# Patient Record
Sex: Female | Born: 1985 | Race: Black or African American | Hispanic: No | Marital: Single | Smoking: Former smoker
Health system: Southern US, Community
[De-identification: ages and names within clinical notes are randomized; demographics above are authoritative.]

---

## 2009-09-14 ENCOUNTER — Ambulatory Visit: Payer: Self-pay | Admitting: Unknown Physician Specialty

## 2011-05-11 IMAGING — US US OB < 14 WEEKS
1 series · 17 of 28 positions shown · non-contrast
Comparison: none

REASON FOR EXAM: bleeding with tissue passage
COMMENTS:   May transport without cardiac monitor

[Series 1: us ob < 14 weeks · 17 of 41 slices shown]
[im 1/41]
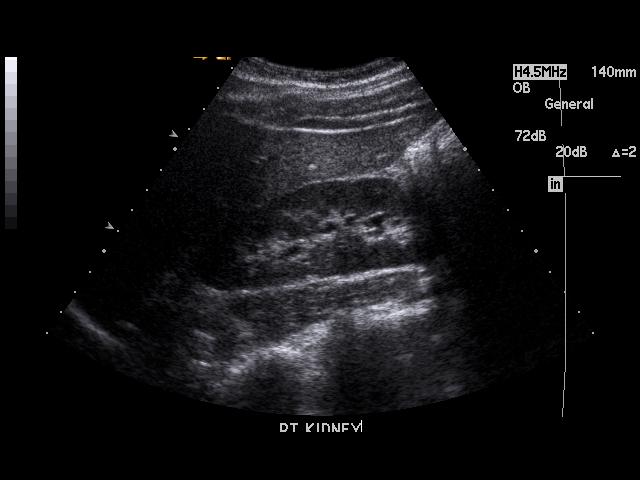
[im 3/41]
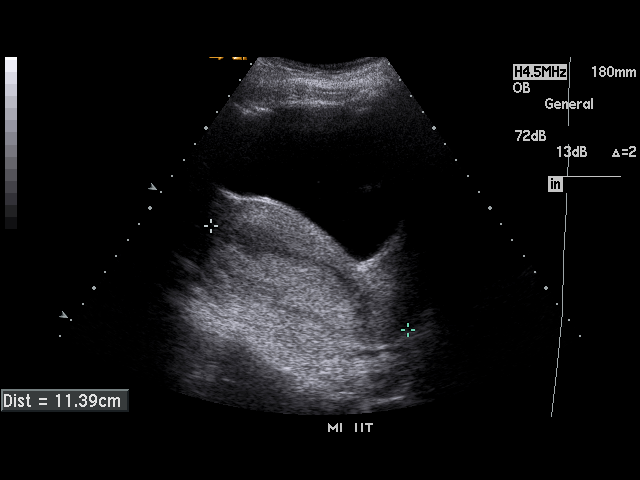
[im 6/41]
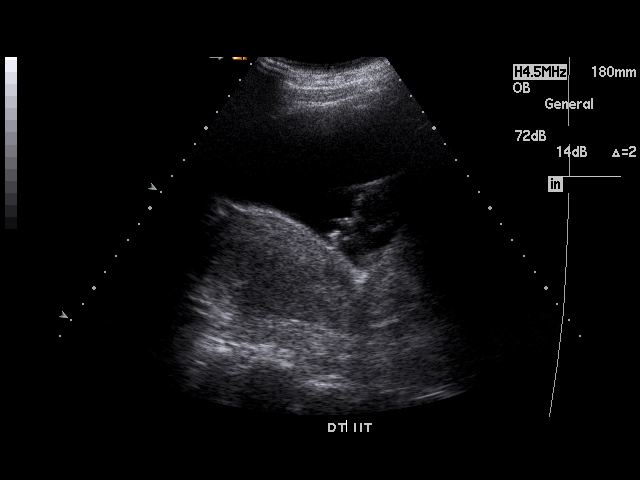
[im 8/41]
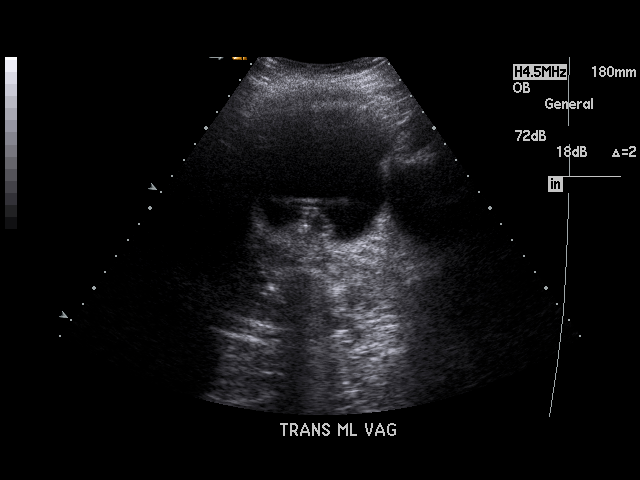
[im 11/41]
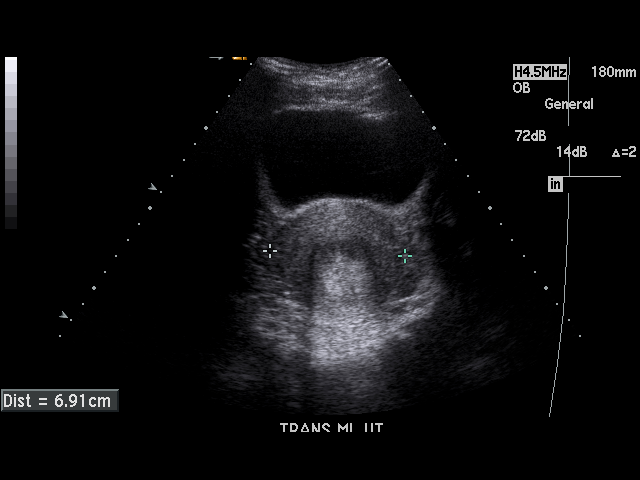
[im 14/41]
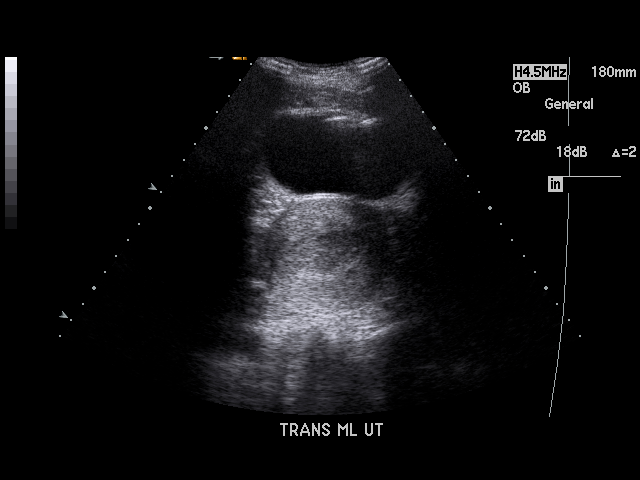
[im 15/41]
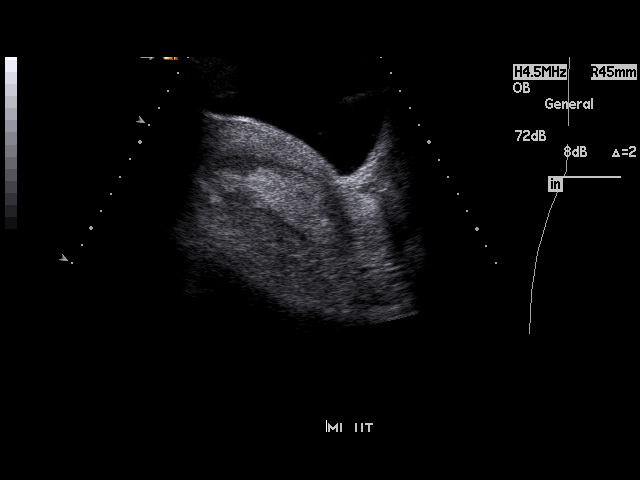
[im 18/41]
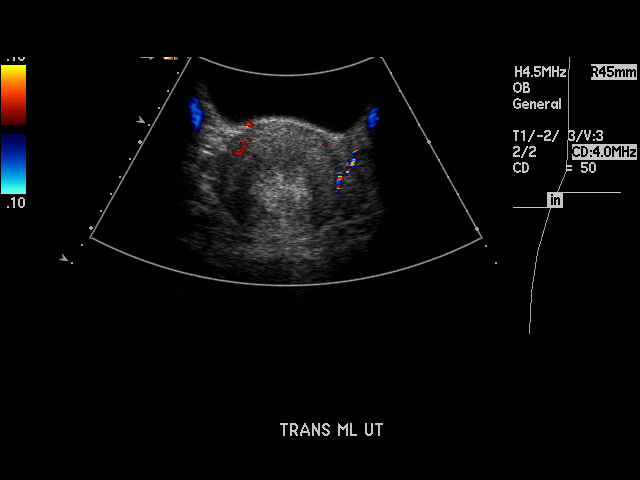
[im 21/41]
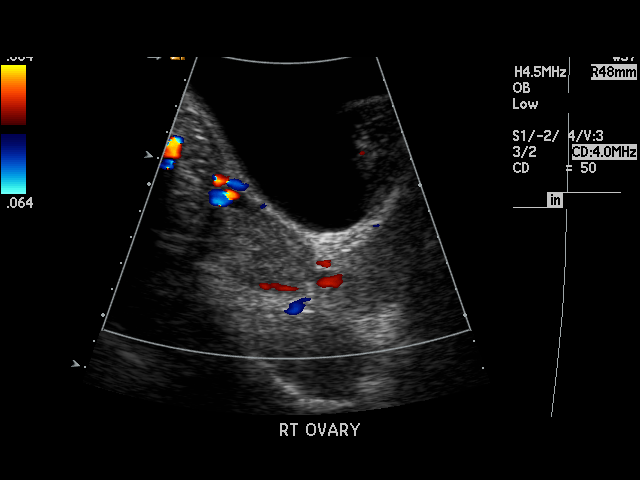
[im 23/41]
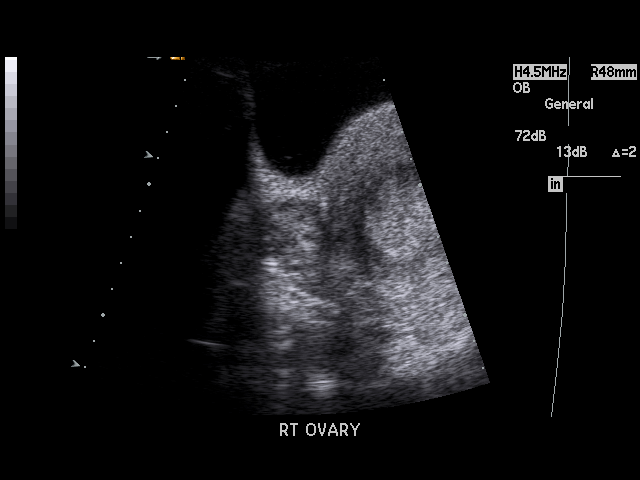
[im 26/41]
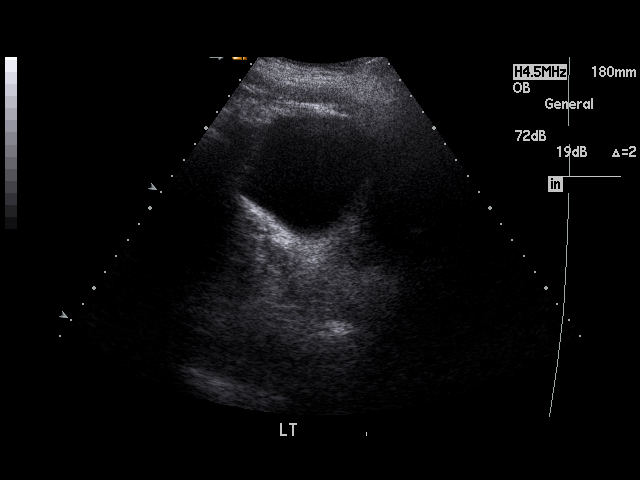
[im 27/41]
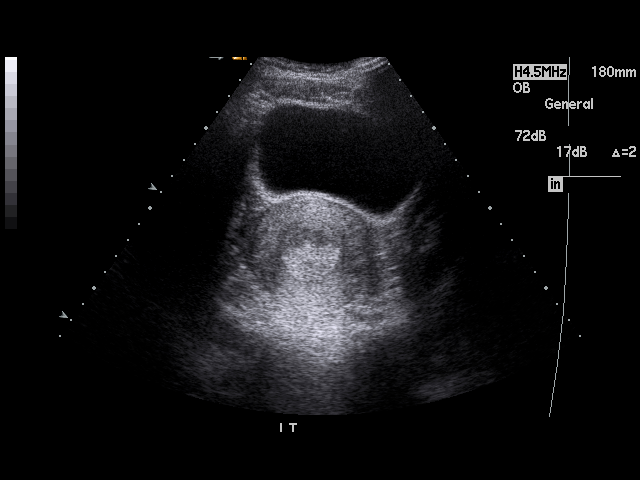
[im 30/41]
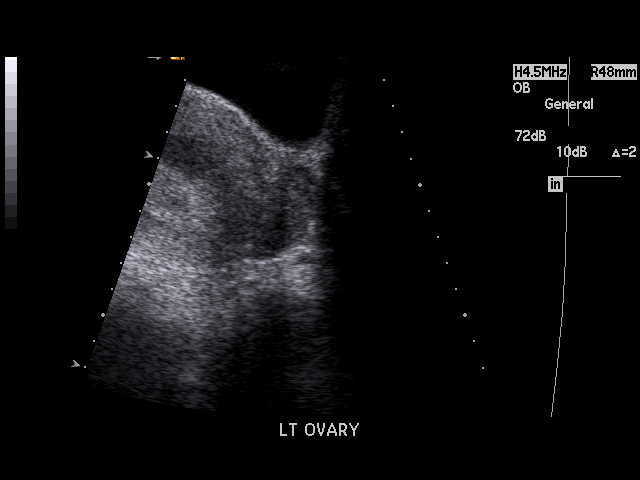
[im 33/41]
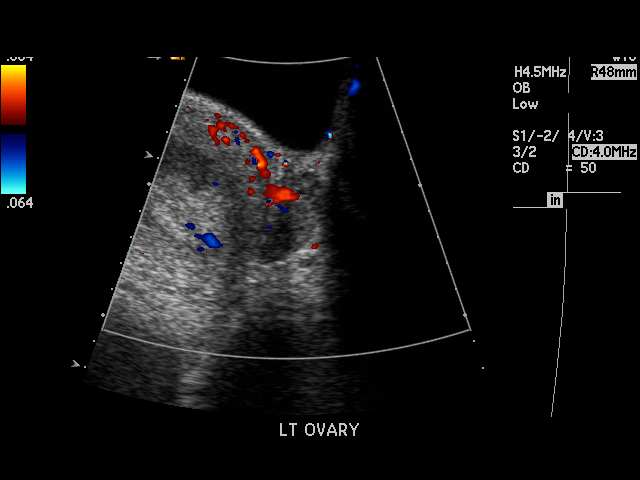
[im 35/41]
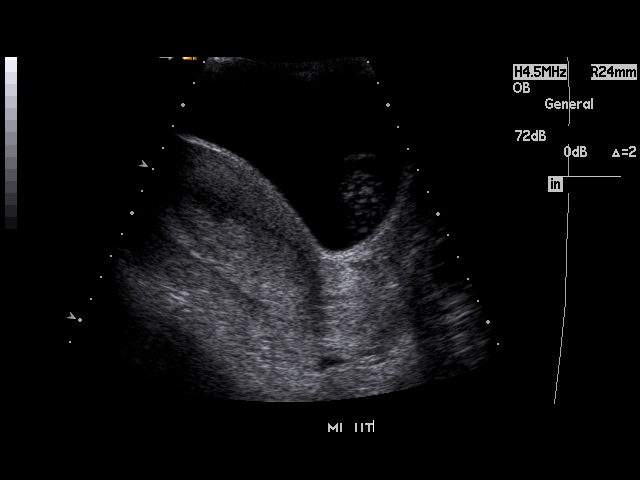
[im 38/41]
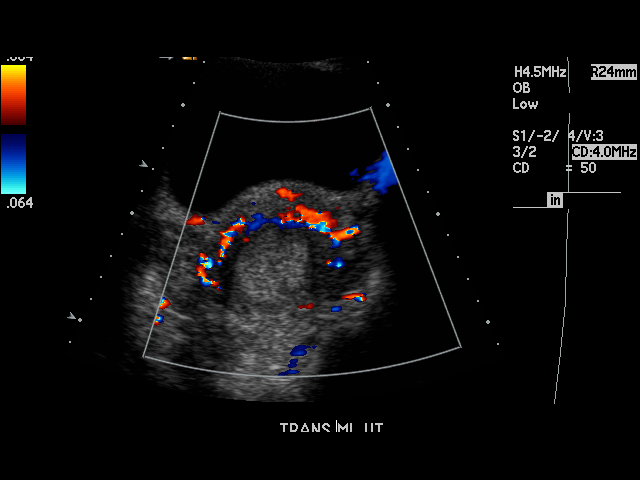
[im 41/41]
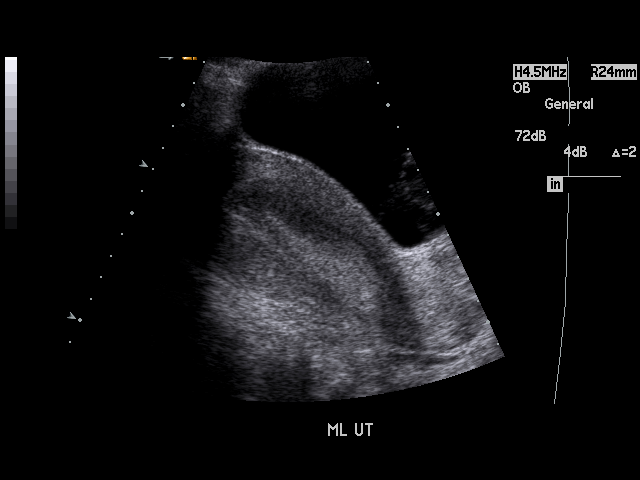

[17 of 28 positions shown; findings below may reference images not displayed]

PROCEDURE:     US  - US OB LESS THAN 14 WEEKS  - September 14, 2009 [DATE]

RESULT:     No intrauterine products of conception are seen. The endometrium
is thickened and measures 2.34 cm at maximum thickness. The possibility of
clotted blood within the endometrial cavity cannot be excluded. No
intrauterine gestation or gestational products are identified. The right and
left ovaries are visualized. The right ovary measures 4.31 cm at maximum
diameter and the left ovary measures 3.24 cm at maximum diameter. No
abnormal adnexal masses are seen. No free fluid is identified in the pelvis.
The visualized portion of the urinary bladder is normal in appearance. The
kidneys show no hydronephrosis.
IMPRESSION: 1. No intrauterine pregnancy is identified. In a pregnant patient, this
finding can be seen secondary to recent abortion, very early intrauterine
gestation that is not yet visible sonographically or to ectopic pregnancy.
If clinically indicated continued follow-up by hCG determinations or
ultrasound is recommended.
2. There may be blood clot in the endometrial cavity but no products of
conception are seen.
3. No abnormal adnexal masses are noted.
4. No free fluid is seen in the pelvis.

## 2015-10-19 ENCOUNTER — Emergency Department
Admission: EM | Admit: 2015-10-19 | Discharge: 2015-10-19 | Disposition: A | Discharge status: Home / Self Care | Payer: Self-pay | Attending: Emergency Medicine | Admitting: Emergency Medicine

## 2015-10-19 ENCOUNTER — Emergency Department: Payer: Self-pay

## 2015-10-19 ENCOUNTER — Encounter: Payer: Self-pay | Admitting: Emergency Medicine

## 2015-10-19 DIAGNOSIS — F17210 Nicotine dependence, cigarettes, uncomplicated: Secondary | ICD-10-CM | POA: Insufficient documentation

## 2015-10-19 DIAGNOSIS — N939 Abnormal uterine and vaginal bleeding, unspecified: Secondary | ICD-10-CM | POA: Insufficient documentation

## 2015-10-19 DIAGNOSIS — Z3202 Encounter for pregnancy test, result negative: Secondary | ICD-10-CM | POA: Insufficient documentation

## 2015-10-19 DIAGNOSIS — R1030 Lower abdominal pain, unspecified: Secondary | ICD-10-CM | POA: Insufficient documentation

## 2015-10-19 DIAGNOSIS — R109 Unspecified abdominal pain: Secondary | ICD-10-CM

## 2015-10-19 LAB — WET PREP, GENITAL
Clue Cells Wet Prep HPF POC: NONE SEEN
SPERM: NONE SEEN
Trich, Wet Prep: NONE SEEN
YEAST WET PREP: NONE SEEN

## 2015-10-19 LAB — CHLAMYDIA/NGC RT PCR (ARMC ONLY)
Chlamydia Tr: NOT DETECTED
N gonorrhoeae: NOT DETECTED

## 2015-10-19 LAB — CBC
HCT: 38.4 % (ref 35.0–47.0)
Hemoglobin: 13.1 g/dL (ref 12.0–16.0)
MCH: 31.7 pg (ref 26.0–34.0)
MCHC: 34 g/dL (ref 32.0–36.0)
MCV: 93.2 fL (ref 80.0–100.0)
PLATELETS: 167 10*3/uL (ref 150–440)
RBC: 4.12 MIL/uL (ref 3.80–5.20)
RDW: 13.3 % (ref 11.5–14.5)
WBC: 3.5 10*3/uL — AB (ref 3.6–11.0)

## 2015-10-19 LAB — COMPREHENSIVE METABOLIC PANEL
ALT: 12 U/L — AB (ref 14–54)
AST: 14 U/L — AB (ref 15–41)
Albumin: 4.3 g/dL (ref 3.5–5.0)
Alkaline Phosphatase: 62 U/L (ref 38–126)
Anion gap: 6 (ref 5–15)
BUN: 10 mg/dL (ref 6–20)
CHLORIDE: 108 mmol/L (ref 101–111)
CO2: 26 mmol/L (ref 22–32)
CREATININE: 0.85 mg/dL (ref 0.44–1.00)
Calcium: 9.4 mg/dL (ref 8.9–10.3)
GFR calc non Af Amer: >60 mL/min (ref >60)
Glucose, Bld: 96 mg/dL (ref 65–99)
POTASSIUM: 3.7 mmol/L (ref 3.5–5.1)
SODIUM: 140 mmol/L (ref 135–145)
Total Bilirubin: 0.7 mg/dL (ref 0.3–1.2)
Total Protein: 6.9 g/dL (ref 6.5–8.1)

## 2015-10-19 LAB — URINALYSIS COMPLETE WITH MICROSCOPIC (ARMC ONLY)
Bilirubin Urine: NEGATIVE
Glucose, UA: NEGATIVE mg/dL
Ketones, ur: NEGATIVE mg/dL
Leukocytes, UA: NEGATIVE
Nitrite: NEGATIVE
Protein, ur: NEGATIVE mg/dL
Specific Gravity, Urine: 1.015 (ref 1.005–1.030)
pH: 6 (ref 5.0–8.0)

## 2015-10-19 LAB — PREGNANCY, URINE: Preg Test, Ur: NEGATIVE

## 2015-10-19 MED ORDER — ACETAMINOPHEN 500 MG PO TABS
1000.0000 mg | ORAL_TABLET | Freq: Once | ORAL | Status: AC
  Administered 2015-10-19: 1000 mg via ORAL
  Filled 2015-10-19: qty 2

## 2015-10-19 MED ORDER — TRAMADOL HCL 50 MG PO TABS: 50.0000 mg | ORAL_TABLET | Freq: Four times a day (QID) | ORAL | Status: AC | PRN

## 2015-10-19 NOTE — ED Notes (Addendum)
Pt reports heavy vaginal bleeding x 2 weeks. States this is heavier than usual and that she is passing clots as well as very dark blood. Pt states pain is worse than it has been. Pain 10/10. NAD noted.

## 2015-10-19 NOTE — ED Notes (Signed)
Patient complaining of "sharp" abdominal pain and vaginal bleeding.  "It really hurts that's why I'm here today, the pain is starting to get worse".  Dark blood being passed vaginally.  Estimates using 4 regular tampons a day.  States this has been going on for 2 weeks.  States that pain starts at her umbilicus and then cramps down to her vagina.

## 2015-10-19 NOTE — ED Provider Notes (Signed)
[facility] Emergency Department Provider Note  Time seen: 8:17 AM  I have reviewed the triage vital signs and the nursing notes.   HISTORY  Chief Complaint Abdominal Cramping    HPI Valerie Christian is a 30 y.o. female with no past medical history presents to the emergency department with lower abdominal discomfort. According to the patient for the past 2 weeks she has been having intermittent pain starting at her belly button extending down into her vagina. States she is also been having bleeding. Patient is on Depo-Provera and states it is somewhat normal for her to have intermittent spotting, but states this has been fairly regular bleeding for the past 2 weeks. Denies any vaginal discharge. Patient is sexually active. Describes her discomfort as moderate when it occurs, worse at night. States mild discomfort currently.Describes it as a cramping pain.     History reviewed. No pertinent past medical history.  There are no active problems to display for this patient.   History reviewed. No pertinent past surgical history.  No current outpatient prescriptions on file.  Allergies Review of patient's allergies indicates no known allergies.  No family history on file.  Social History Social History  Substance Use Topics  . Smoking status: Current Every Day Smoker -- 0.50 packs/day for 1 years    Types: Cigarettes  . Smokeless tobacco: None  . Alcohol Use: Yes     Comment: Occasionally    Review of Systems Constitutional: Negative for fever. Cardiovascular: Negative for chest pain. Respiratory: Negative for shortness of breath. Gastrointestinal: Positive lower abdominal pain. Negative for nausea, vomiting, diarrhea. Genitourinary: Negative for dysuria. Positive for vaginal bleeding. Negative for vaginal discharge. Musculoskeletal: Negative for back pain. Neurological: Negative for headache 10-point ROS otherwise  negative.  ____________________________________________   PHYSICAL EXAM:  VITAL SIGNS: ED Triage Vitals  Enc Vitals Group     BP 10/19/15 0742 113/63 mmHg     Pulse Rate 10/19/15 0742 96     Resp --      Temp 10/19/15 0742 98.2 F (36.8 C)     Temp Source 10/19/15 0742 Oral     SpO2 10/19/15 0742 100 %     Weight 10/19/15 0742 110 lb (49.896 kg)     Height 10/19/15 0742  (1.651 m)     Head Cir --      Peak Flow --      Pain Score 10/19/15 0744 10     Pain Loc --      Pain Edu? --      Excl. in GC? --     Constitutional: Alert and oriented. Well appearing and in no distress. Eyes: Normal exam ENT   Head: Normocephalic and atraumatic.   Mouth/Throat: Mucous membranes are moist. Cardiovascular: Normal rate, regular rhythm. No murmur Respiratory: Normal respiratory effort without tachypnea nor retractions. Breath sounds are clear  Gastrointestinal: Soft, nontender. No distention. No CVA tenderness. Musculoskeletal: Nontender with normal range of motion in all extremities.  Neurologic:  Normal speech and language. No gross focal neurologic deficits Skin:  Skin is warm, dry and intact.  Psychiatric: Mood and affect are normal. Speech and behavior are normal. ____________________________________________    RADIOLOGY  Ultrasound is normal  ____________________________________________    INITIAL IMPRESSION / ASSESSMENT AND PLAN / ED COURSE  Pertinent labs & imaging results that were available during my care of the patient were reviewed by me and considered in my medical decision making (see chart for details).  The  patient presents to the emergency department lower abdominal cramping which starts in her bellybutton extends down to her vagina per patient. This has been intermittent for the past [redacted] weeks along with vaginal bleeding for the past 2 weeks. Patient has a nontender abdominal exam. We will check labs, perform a pelvic examination, and likely proceed  with a transvaginal ultrasound.  Patient's pelvic exam shows minimal left adnexal tenderness palpation, otherwise within normal limits. Mild amount of vaginal bleeding from the cervix. No abnormal vaginal discharge noted.  Labs and also largely within normal limits. Patient will follow-up with the health department., I will also refer to [facility] at [facility].  ____________________________________________   FINAL CLINICAL IMPRESSION(S) / ED DIAGNOSES  Lower abdominal pain Vaginal bleeding  Minna Antis, MD 10/19/15 1218

## 2015-10-19 NOTE — Discharge Instructions (Signed)
You have been seen in the emergency department for vaginal bleeding and lower abdominal cramping. Her workup including labs and ultrasound of show normal results. These call OB/GYN for an appointment to discuss her current birth control, and the possibility of switching to a different birth control such as an IUD or an estrogen containing pill. Return to the emergency department for any worsening pain, fever, or any other symptom personally concerning to your self.   Abnormal Uterine Bleeding Abnormal uterine bleeding can affect women at various stages in life, including teenagers, women in their reproductive years, pregnant women, and women who have reached menopause. Several kinds of uterine bleeding are considered abnormal, including:  Bleeding or spotting between periods.   Bleeding after sexual intercourse.   Bleeding that is heavier or more than normal.   Periods that last longer than usual.  Bleeding after menopause.  Many cases of abnormal uterine bleeding are minor and simple to treat, while others are more serious. Any type of abnormal bleeding should be evaluated by your health care provider. Treatment will depend on the cause of the bleeding. HOME CARE INSTRUCTIONS Monitor your condition for any changes. The following actions may help to alleviate any discomfort you are experiencing:  Avoid the use of tampons and douches as directed by your health care provider.  Change your pads frequently. You should get regular pelvic exams and Pap tests. Keep all follow-up appointments for diagnostic tests as directed by your health care provider.  SEEK MEDICAL CARE IF:   Your bleeding lasts more than 1 week.   You feel dizzy at times.  SEEK IMMEDIATE MEDICAL CARE IF:   You pass out.   You are changing pads every 15 to 30 minutes.   You have abdominal pain.  You have a fever.   You become sweaty or weak.   You are passing large blood clots from the vagina.   You  start to feel nauseous and vomit. MAKE SURE YOU:   Understand these instructions.  Will watch your condition.  Will get help right away if you are not doing well or get worse.   This information is not intended to replace advice given to you by your health care provider. Make sure you discuss any questions you have with your health care provider.   Document Released: 09/02/2005 Document Revised: 09/07/2013 Document Reviewed: 04/01/2013 Elsevier Interactive Patient Education [employer]

## 2016-03-21 DIAGNOSIS — F17210 Nicotine dependence, cigarettes, uncomplicated: Secondary | ICD-10-CM | POA: Insufficient documentation

## 2016-03-21 DIAGNOSIS — R102 Pelvic and perineal pain: Secondary | ICD-10-CM | POA: Insufficient documentation

## 2016-03-21 LAB — CBC
HEMATOCRIT: 40.5 % (ref 35.0–47.0)
Hemoglobin: 14.2 g/dL (ref 12.0–16.0)
MCH: 32.6 pg (ref 26.0–34.0)
MCHC: 35 g/dL (ref 32.0–36.0)
MCV: 93.2 fL (ref 80.0–100.0)
PLATELETS: 201 10*3/uL (ref 150–440)
RBC: 4.34 MIL/uL (ref 3.80–5.20)
RDW: 13.1 % (ref 11.5–14.5)
WBC: 4.4 10*3/uL (ref 3.6–11.0)

## 2016-03-21 LAB — BASIC METABOLIC PANEL
Anion gap: 6 (ref 5–15)
BUN: 12 mg/dL (ref 6–20)
CALCIUM: 9.3 mg/dL (ref 8.9–10.3)
CO2: 27 mmol/L (ref 22–32)
Chloride: 103 mmol/L (ref 101–111)
Creatinine, Ser: 0.87 mg/dL (ref 0.44–1.00)
GFR calc Af Amer: >60 mL/min (ref >60)
GLUCOSE: 90 mg/dL (ref 65–99)
Potassium: 3.5 mmol/L (ref 3.5–5.1)
Sodium: 136 mmol/L (ref 135–145)

## 2016-03-21 LAB — URINALYSIS COMPLETE WITH MICROSCOPIC (ARMC ONLY)
BACTERIA UA: NONE SEEN
BILIRUBIN URINE: NEGATIVE
GLUCOSE, UA: NEGATIVE mg/dL
HGB URINE DIPSTICK: NEGATIVE
Ketones, ur: NEGATIVE mg/dL
Leukocytes, UA: NEGATIVE
Nitrite: NEGATIVE
PH: 7 (ref 5.0–8.0)
Protein, ur: NEGATIVE mg/dL
Specific Gravity, Urine: 1.013 (ref 1.005–1.030)

## 2016-03-21 LAB — HCG, QUANTITATIVE, PREGNANCY

## 2016-03-21 NOTE — ED Notes (Signed)
Pt in with co lower abd pain x 2 days with n.v.d. Pt has positive preg test 3 weeks ago, started having vag bleeding states spotting.

## 2016-03-22 ENCOUNTER — Emergency Department
Admission: EM | Admit: 2016-03-22 | Discharge: 2016-03-22 | Discharge status: Against Medical Advice | Payer: Self-pay | Attending: Emergency Medicine | Admitting: Emergency Medicine

## 2016-03-22 DIAGNOSIS — R102 Pelvic and perineal pain: Secondary | ICD-10-CM

## 2016-03-22 NOTE — ED Provider Notes (Signed)
[facility] Emergency Department Provider Note  ____________________________________________  Time seen: 1:15 AM  I have reviewed the triage vital signs and the nursing notes.   HISTORY  Chief Complaint Abdominal Pain     HPI Valerie Christian is a 30 y.o. female presents with complaint of pelvic pain 2 days accompanied by nausea vomiting and diarrhea. Patient states that she took a home pregnancy test 3 weeks ago which was positive. Patient states that she's had vaginal spotting with onset today. Of note patient states that she recently discontinued birth control in March and has not had a menses since that time. Patient denies any fever or vaginal discharge. On my arrival to room the patient immediately 1 and no the results of her pregnancy test and when she was informed that it was negative she stated that she did not need any further evaluation is requesting discharge    Past medical history No pertinent past medical history There are no active problems to display for this patient.   Past surgical history No pertinent past surgical history  Current Outpatient Rx  Name  Route  Sig  Dispense  Refill  . Ibuprofen-Diphenhydramine HCl (ADVIL PM) 200-25 MG CAPS   Oral   Take 1 capsule by mouth as needed.         . traMADol (ULTRAM) 50 MG tablet   Oral   Take 1 tablet (50 mg total) by mouth every 6 (six) hours as needed.   10 tablet   0     Allergies No known drug allergies No family history on file.  Social History Social History  Substance Use Topics  . Smoking status: Current Every Day Smoker -- 0.50 packs/day for 1 years    Types: Cigarettes  . Smokeless tobacco: Not on file  . Alcohol Use: Yes     Comment: Occasionally    Review of Systems  Constitutional: Negative for fever. Eyes: Negative for visual changes. ENT: Negative for sore throat. Cardiovascular: Negative for chest pain. Respiratory: Negative for shortness of  breath. Gastrointestinal: Negative for abdominal pain, vomiting and diarrhea. Genitourinary: Negative for dysuria.Positive for vaginal spotting and pelvic pain Musculoskeletal: Negative for back pain. Skin: Negative for rash. Neurological: Negative for headaches, focal weakness or numbness.   10-point ROS otherwise negative.  ____________________________________________   PHYSICAL EXAM:  VITAL SIGNS: ED Triage Vitals  Enc Vitals Group     BP 03/21/16 2206 116/66 mmHg     Pulse Rate 03/21/16 2206 82     Resp 03/21/16 2206 18     Temp 03/21/16 2206 98.5 F (36.9 C)     Temp Source 03/21/16 2206 Oral     SpO2 03/21/16 2206 99 %     Weight 03/21/16 2206 125 lb (56.7 kg)     Height 03/21/16 2206 5\' 6"  (1.676 m)     Head Cir --      Peak Flow --      Pain Score 03/21/16 2214 0     Pain Loc --      Pain Edu? --      Excl. in GC? --      Constitutional: Alert and oriented. Well appearing and in no distress. Eyes: Conjunctivae are normal. PERRL. Normal extraocular movements. ENT   Head: Normocephalic and atraumatic.   Nose: No congestion/rhinnorhea.   Mouth/Throat: Mucous membranes are moist.   Neck: No stridor. Hematological/Lymphatic/Immunilogical: No cervical lymphadenopathy. Cardiovascular: Normal rate, regular rhythm. Normal and symmetric distal pulses are present in  all extremities. No murmurs, rubs, or gallops. Respiratory: Normal respiratory effort without tachypnea nor retractions. Breath sounds are clear and equal bilaterally. No wheezes/rales/rhonchi. Gastrointestinal: Soft and nontender. No distention. There is no CVA tenderness. Genitourinary: deferred Musculoskeletal: Nontender with normal range of motion in all extremities. No joint effusions.  No lower extremity tenderness nor edema. Neurologic:  Normal speech and language. No gross focal neurologic deficits are appreciated. Speech is normal.  Skin:  Skin is warm, dry and intact. No rash  noted. Psychiatric: Mood and affect are normal. Speech and behavior are normal. Patient exhibits appropriate insight and judgment.  ____________________________________________    LABS (pertinent positives/negatives)  Labs Reviewed  URINALYSIS COMPLETEWITH MICROSCOPIC ([facility] ONLY) - Abnormal; Notable for the following:    Color, Urine YELLOW (*)    APPearance CLEAR (*)    Squamous Epithelial / LPF 0-5 (*)    All other components within normal limits  CBC  BASIC METABOLIC PANEL  HCG, QUANTITATIVE, PREGNANCY      Procedures    INITIAL IMPRESSION / ASSESSMENT AND PLAN / ED COURSE  Pertinent labs & imaging results that were available during my care of the patient were reviewed by me and considered in my medical decision making (see chart for details).    ____________________________________________   FINAL CLINICAL IMPRESSION(S) / ED DIAGNOSES  Final diagnoses:  Pelvic pain in female      Darci Currentandolph N Brown, MD 03/22/16 [phone]

## 2016-03-22 NOTE — ED Notes (Signed)
MD Manson PasseyBrown at bedside. Pt was told not pregnant, and pt immediately left and stated did not need any further treatment. MD Manson PasseyBrown at bedside during this convo. Pt left AMA at this time.

## 2016-05-29 ENCOUNTER — Encounter: Payer: Self-pay | Admitting: Emergency Medicine

## 2016-05-29 ENCOUNTER — Emergency Department
Admission: EM | Admit: 2016-05-29 | Discharge: 2016-05-29 | Disposition: A | Discharge status: Home / Self Care | Payer: Self-pay | Attending: Student in an Organized Health Care Education/Training Program | Admitting: Student in an Organized Health Care Education/Training Program

## 2016-05-29 DIAGNOSIS — M7918 Myalgia, other site: Secondary | ICD-10-CM

## 2016-05-29 DIAGNOSIS — R0981 Nasal congestion: Secondary | ICD-10-CM | POA: Insufficient documentation

## 2016-05-29 DIAGNOSIS — R519 Headache, unspecified: Secondary | ICD-10-CM

## 2016-05-29 DIAGNOSIS — R1030 Lower abdominal pain, unspecified: Secondary | ICD-10-CM | POA: Insufficient documentation

## 2016-05-29 DIAGNOSIS — Z791 Long term (current) use of non-steroidal anti-inflammatories (NSAID): Secondary | ICD-10-CM | POA: Insufficient documentation

## 2016-05-29 DIAGNOSIS — E876 Hypokalemia: Secondary | ICD-10-CM | POA: Insufficient documentation

## 2016-05-29 DIAGNOSIS — F17210 Nicotine dependence, cigarettes, uncomplicated: Secondary | ICD-10-CM | POA: Insufficient documentation

## 2016-05-29 DIAGNOSIS — R51 Headache: Secondary | ICD-10-CM | POA: Insufficient documentation

## 2016-05-29 DIAGNOSIS — R112 Nausea with vomiting, unspecified: Secondary | ICD-10-CM | POA: Insufficient documentation

## 2016-05-29 DIAGNOSIS — N939 Abnormal uterine and vaginal bleeding, unspecified: Secondary | ICD-10-CM | POA: Insufficient documentation

## 2016-05-29 LAB — URINALYSIS COMPLETE WITH MICROSCOPIC (ARMC ONLY)
BILIRUBIN URINE: NEGATIVE
Bacteria, UA: NONE SEEN
GLUCOSE, UA: NEGATIVE mg/dL
HGB URINE DIPSTICK: NEGATIVE
LEUKOCYTES UA: NEGATIVE
NITRITE: NEGATIVE
Protein, ur: NEGATIVE mg/dL
SPECIFIC GRAVITY, URINE: 1.01 (ref 1.005–1.030)
pH: 7 (ref 5.0–8.0)

## 2016-05-29 LAB — CBC WITH DIFFERENTIAL/PLATELET
BASOS PCT: 0 %
Basophils Absolute: 0 10*3/uL (ref 0–0.1)
Eosinophils Absolute: 0 10*3/uL (ref 0–0.7)
Eosinophils Relative: 0 %
HEMATOCRIT: 40.8 % (ref 35.0–47.0)
Hemoglobin: 14.3 g/dL (ref 12.0–16.0)
Lymphocytes Relative: 48 %
Lymphs Abs: 2.2 10*3/uL (ref 1.0–3.6)
MCH: 32.9 pg (ref 26.0–34.0)
MCHC: 34.9 g/dL (ref 32.0–36.0)
MCV: 94.1 fL (ref 80.0–100.0)
MONO ABS: 0.4 10*3/uL (ref 0.2–0.9)
Monocytes Relative: 8 %
NEUTROS ABS: 2 10*3/uL (ref 1.4–6.5)
NEUTROS PCT: 44 %
Platelets: 180 10*3/uL (ref 150–440)
RBC: 4.34 MIL/uL (ref 3.80–5.20)
RDW: 13 % (ref 11.5–14.5)
WBC: 4.6 10*3/uL (ref 3.6–11.0)

## 2016-05-29 LAB — COMPREHENSIVE METABOLIC PANEL
ALK PHOS: 67 U/L (ref 38–126)
ALT: 11 U/L — ABNORMAL LOW (ref 14–54)
ANION GAP: 7 (ref 5–15)
AST: 19 U/L (ref 15–41)
Albumin: 4.7 g/dL (ref 3.5–5.0)
BILIRUBIN TOTAL: 0.6 mg/dL (ref 0.3–1.2)
BUN: 7 mg/dL (ref 6–20)
CALCIUM: 9.7 mg/dL (ref 8.9–10.3)
CO2: 27 mmol/L (ref 22–32)
Chloride: 105 mmol/L (ref 101–111)
Creatinine, Ser: 0.86 mg/dL (ref 0.44–1.00)
GFR calc non Af Amer: >60 mL/min (ref >60)
GLUCOSE: 105 mg/dL — AB (ref 65–99)
Potassium: 3.1 mmol/L — ABNORMAL LOW (ref 3.5–5.1)
Sodium: 139 mmol/L (ref 135–145)
Total Protein: 7.5 g/dL (ref 6.5–8.1)

## 2016-05-29 LAB — POCT PREGNANCY, URINE: PREG TEST UR: NEGATIVE

## 2016-05-29 MED ORDER — POTASSIUM CHLORIDE ER 10 MEQ PO TBCR
10.0000 meq | EXTENDED_RELEASE_TABLET | Freq: Every day | ORAL | 0 refills | Status: AC
Start: 2016-05-29 — End: ?

## 2016-05-29 MED ORDER — INDOMETHACIN 50 MG PO CAPS: 50.0000 mg | ORAL_CAPSULE | Freq: Once | ORAL | Status: DC

## 2016-05-29 MED ORDER — POTASSIUM CHLORIDE CRYS ER 20 MEQ PO TBCR
20.0000 meq | EXTENDED_RELEASE_TABLET | Freq: Once | ORAL | Status: AC
  Administered 2016-05-29: 20 meq via ORAL
  Filled 2016-05-29: qty 1

## 2016-05-29 NOTE — ED Provider Notes (Signed)
[facility] Emergency Department Provider Note   ____________________________________________   First MD Initiated Contact with Patient 05/29/16 1450     (approximate)  I have reviewed the triage vital signs and the nursing notes.   HISTORY  Chief Complaint Generalized Body Aches    HPI Valerie Christian is a 30 y.o. female who presents with body aches x1.5 months, which has worsened over the last week. Patient feels pain intermittently mostly in lower abdomen and mid-low back. She is also experiencing headaches and has been having some vaginal spotting x2-3 weeks. Denies dysuria or vaginal discharge. Lies down and rests when pain is present, has not tried any other therapies. Patient reports stopping depoprovera injections in March, and she reports that she has had unprotected sex since that time. Unsure of fevers, chills. Denies cough, chest pain, SOB, diarrhea or constipation, dizziness, numbness or tingling.    History reviewed. No pertinent past medical history.  There are no active problems to display for this patient.   History reviewed. No pertinent surgical history.  Prior to Admission medications   Medication Sig Start Date End Date Taking? Authorizing Provider  Ibuprofen-Diphenhydramine HCl (ADVIL PM) 200-25 MG CAPS Take 1 capsule by mouth as needed.    Historical Provider, MD  potassium chloride (K-DUR) 10 MEQ tablet Take 1 tablet (10 mEq total) by mouth daily. 05/29/16   Chinita Pester, FNP  traMADol (ULTRAM) 50 MG tablet Take 1 tablet (50 mg total) by mouth every 6 (six) hours as needed. 10/19/15 10/18/16  Minna Antis, MD    Allergies Review of patient's allergies indicates no known allergies.  No family history on file.  Social History Social History  Substance Use Topics  . Smoking status: Current Every Day Smoker    Packs/day: 0.50    Years: 1.00    Types: Cigarettes  . Smokeless tobacco: Never Used  . Alcohol use Yes   Comment: Occasionally    Review of Systems Constitutional: Unsure of fever/chills.  Eyes: No visual changes. ENT: No sore throat. Positive for nasal congestion.  Cardiovascular: Denies chest pain. Respiratory: Denies shortness of breath. Gastrointestinal: Positive for abdominal pain, nausea and vomiting.  No diarrhea.  No constipation. Genitourinary: Negative for dysuria and vaginal discharge. Positive for vaginal spotting.  Musculoskeletal: Positive for mid to low back pain.  Skin: Negative for rash. Neurological: Negative for focal weakness or numbness. Positive for headaches.   ____________________________________________   PHYSICAL EXAM:  VITAL SIGNS: ED Triage Vitals [05/29/16 1428]  Enc Vitals Group     BP (!) 153/77     Pulse Rate (!) 50     Resp (!) 22     Temp 98.5 F (36.9 C)     Temp Source Oral     SpO2 99 %     Weight 125 lb (56.7 kg)     Height 5\' 6"  (1.676 m)     Head Circumference      Peak Flow      Pain Score 7     Pain Loc      Pain Edu?      Excl. in GC?     Constitutional: Alert and oriented. Well appearing and in no acute distress. Eyes: Conjunctivae are normal. PERRL. EOMI. Head: Atraumatic. Nose: Mild nasal congestion bilaterally.  Mouth/Throat: Mucous membranes are moist.  Oropharynx non-erythematous and without swelling.  Neck: No stridor.  No cervical spine tenderness to palpation. Supple.  Hematological/Lymphatic/Immunilogical: No cervical lymphadenopathy. Cardiovascular: Normal rate, regular  rhythm. Grossly normal heart sounds.  Good peripheral circulation. Respiratory: Normal respiratory effort.  No retractions. Lungs CTAB. Gastrointestinal: Soft without distention. Moderately generalized TTP. Normoactive bowel sounds present in all quadrants.  Musculoskeletal: No bony TTP of the spine or paraspinous muscles bilaterally. Full ROM of bilateral upper extremities without pain or difficulty.  Neurologic:  Normal speech and language. No  gross focal neurologic deficits are appreciated. No gait instability. Skin:  Skin is warm, dry and intact. No rash noted. Psychiatric: Mood and affect are normal. Speech and behavior are normal.  ____________________________________________   LABS (all labs ordered are listed, but only abnormal results are displayed)  Labs Reviewed  URINALYSIS COMPLETEWITH MICROSCOPIC ([facility] ONLY) - Abnormal; Notable for the following:       Result Value   Color, Urine YELLOW (*)    APPearance HAZY (*)    Ketones, ur TRACE (*)    Squamous Epithelial / LPF 6-30 (*)    All other components within normal limits  COMPREHENSIVE METABOLIC PANEL - Abnormal; Notable for the following:    Potassium 3.1 (*)    Glucose, Bld 105 (*)    ALT 11 (*)    All other components within normal limits  CBC WITH DIFFERENTIAL/PLATELET  POC URINE PREG, ED  POCT PREGNANCY, URINE   ____________________________________________  EKG  ____________________________________________  RADIOLOGY  ____________________________________________   PROCEDURES  Procedure(s) performed: None  Procedures  Critical Care performed: No  ____________________________________________   INITIAL IMPRESSION / ASSESSMENT AND PLAN / ED COURSE  Pertinent labs & imaging results that were available during my care of the patient were reviewed by me and considered in my medical decision making (see chart for details).  Patient found to have mildly depleted serum potassium of 3.1. Given one tablet potassium in the emergency department, and given prescription for 1 week of potassium supplementation. Advised [facility] if symptoms to not improve with potassium supplementation. No other emergency medicine complaints at this time.   Clinical Course     ____________________________________________   FINAL CLINICAL IMPRESSION(S) / ED DIAGNOSES  Final diagnoses:  Acute hypokalemia  Musculoskeletal pain  Acute  nonintractable headache, unspecified headache type      NEW MEDICATIONS STARTED DURING THIS VISIT:  Discharge Medication List as of 05/29/2016  4:43 PM    START taking these medications   Details  potassium chloride (K-DUR) 10 MEQ tablet Take 1 tablet (10 mEq total) by mouth daily., Starting Wed 05/29/2016, Print         Note:  This document was prepared using Dragon voice recognition software and may include unintentional dictation errors.   Chinita PesterCari B Lorali Khamis, FNP 05/29/16 1722    Willy EddyPatrick Robinson, MD 05/29/16 2142

## 2016-05-29 NOTE — ED Notes (Signed)
Pt called twice to be taken to a room. No answer when called.

## 2016-05-29 NOTE — ED Triage Notes (Signed)
Pt called again with no answer 

## 2016-05-29 NOTE — ED Triage Notes (Signed)
States she has been having generalized body "pain" fro several weeks  With some occasional n/v/d and fever  Unsure of temp.. Last time vomiting was this am  States she had been under a lot of stress  Very tearful in treatment room

## 2017-04-15 IMAGING — US US PELVIS COMPLETE
1 series · 14 of 25 positions shown · non-contrast
Comparison: Prior exam 09/14/2009 unavailable for review.

CLINICAL DATA: Abdominal pain.  Vaginal bleeding.

EXAM:
TRANSABDOMINAL AND TRANSVAGINAL ULTRASOUND OF PELVIS
TECHNIQUE: Both transabdominal and transvaginal ultrasound examinations of the
pelvis were performed. Transabdominal technique was performed for
global imaging of the pelvis including uterus, ovaries, adnexal
regions, and pelvic cul-de-sac. It was necessary to proceed with
endovaginal exam following the transabdominal exam to visualize the
uterus and ovaries..

[Series 1: us pelvis complete · 0.20mm/px · 14 of 79 slices shown]
[im 1/79]
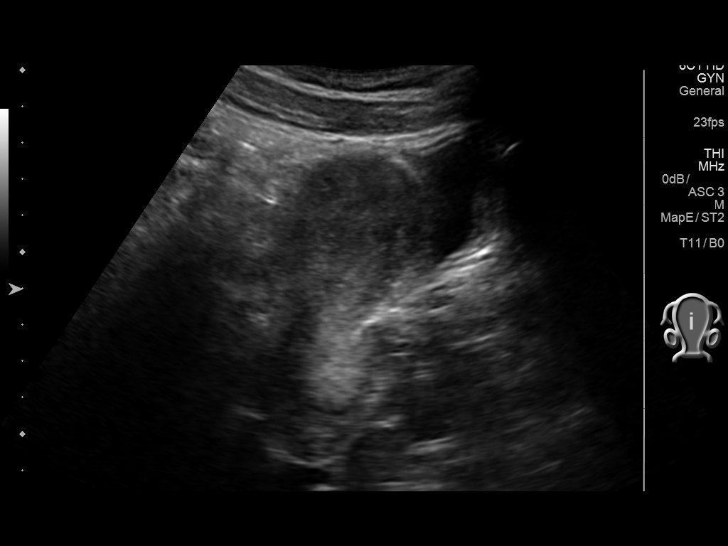
[im 7/79]
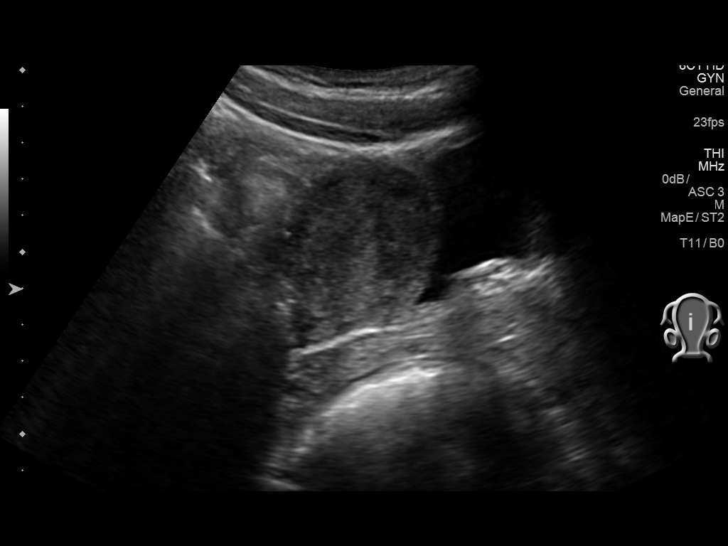
[im 14/79]
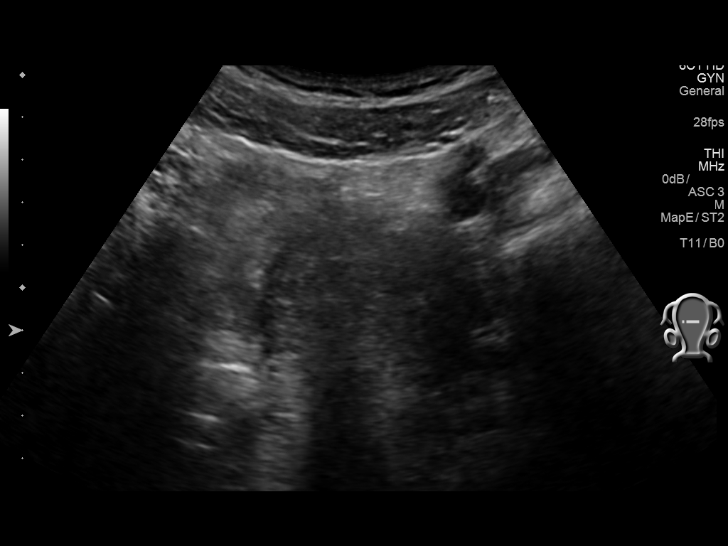
[im 20/79]
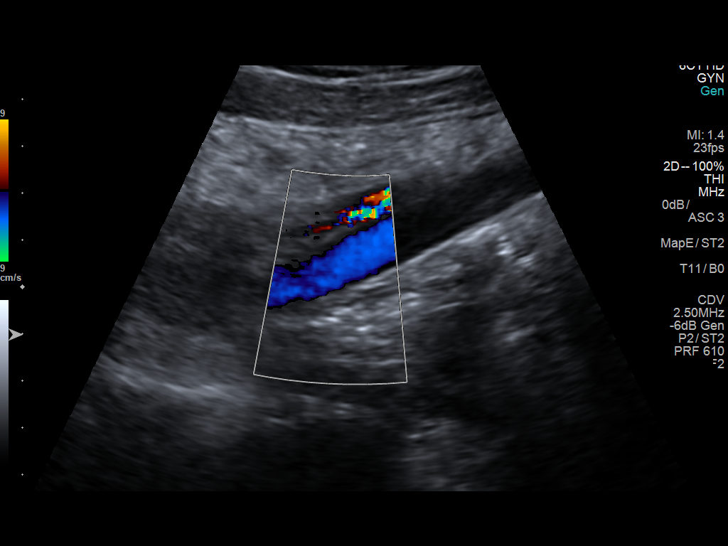
[im 27/79]
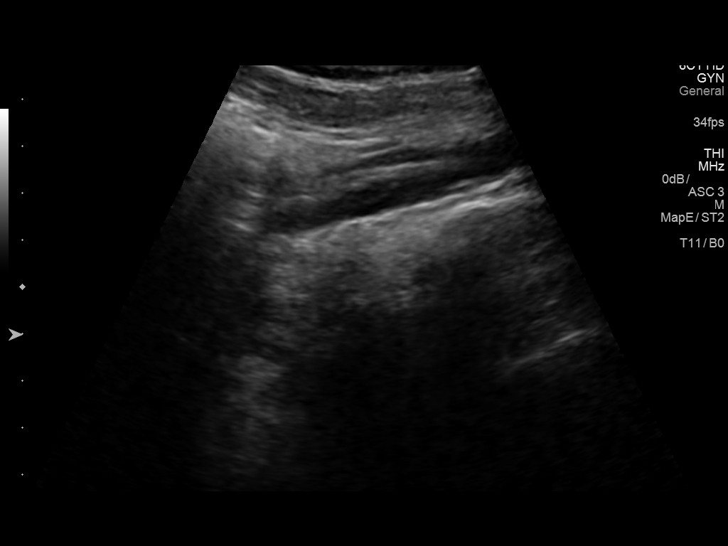
[im 30/79]
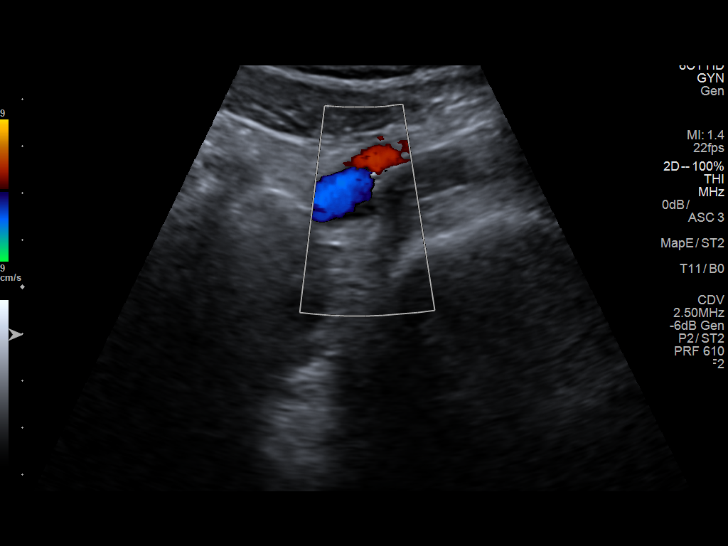
[im 36/79]
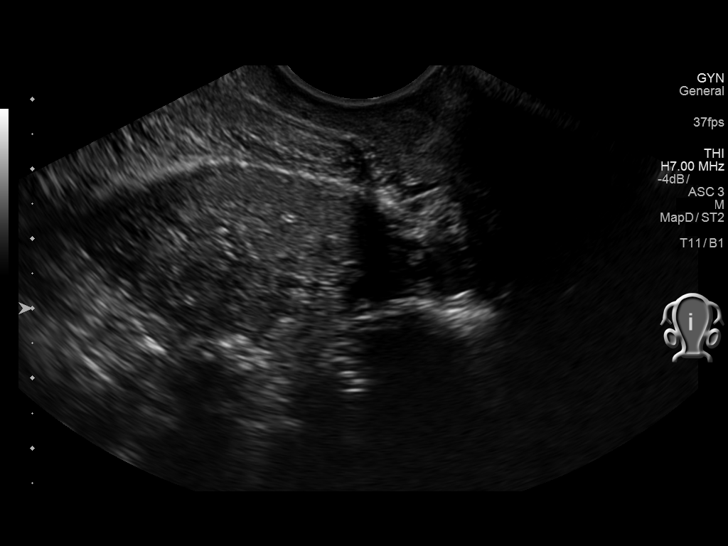
[im 43/79]
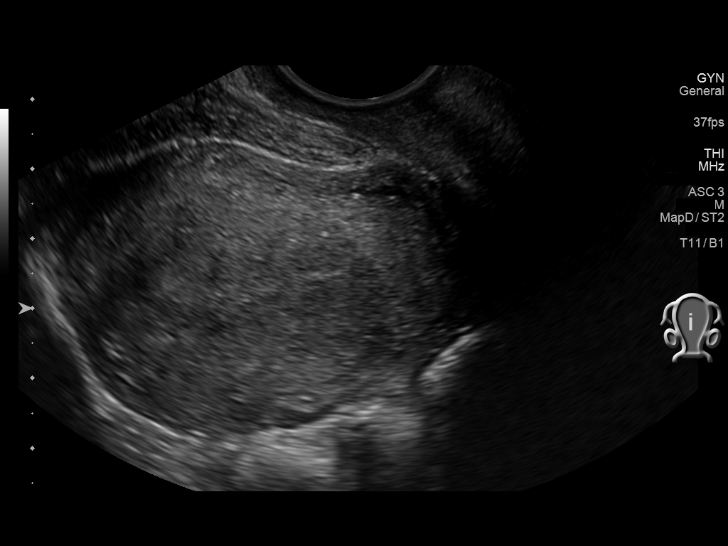
[im 49/79]
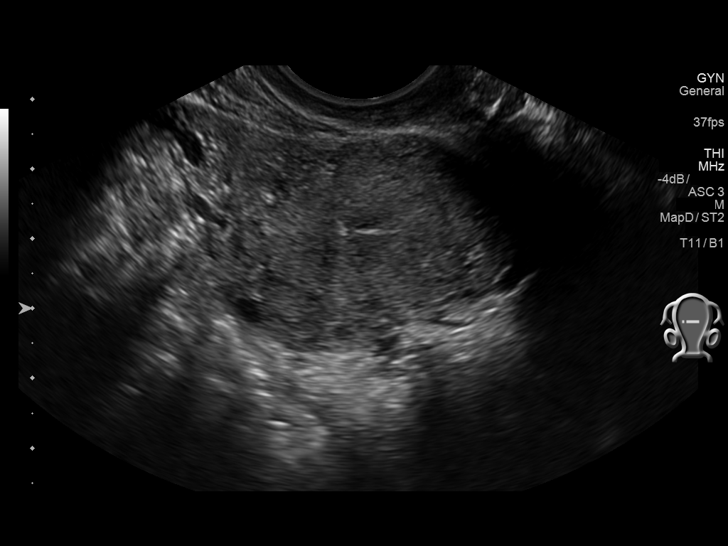
[im 53/79]
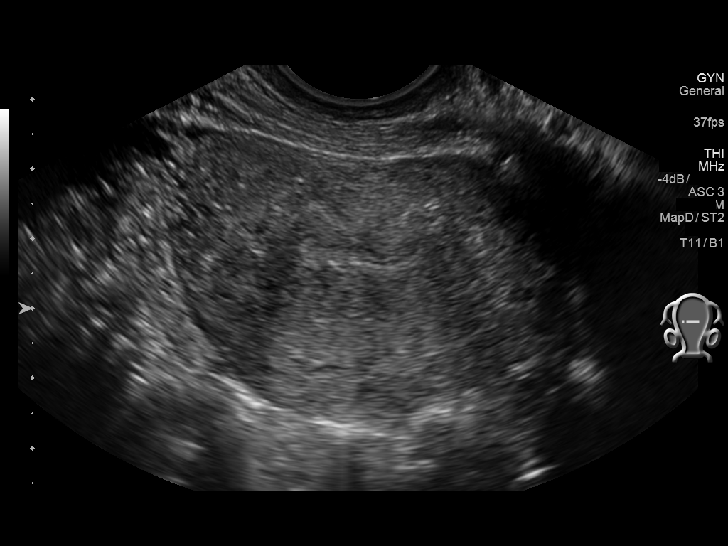
[im 59/79]
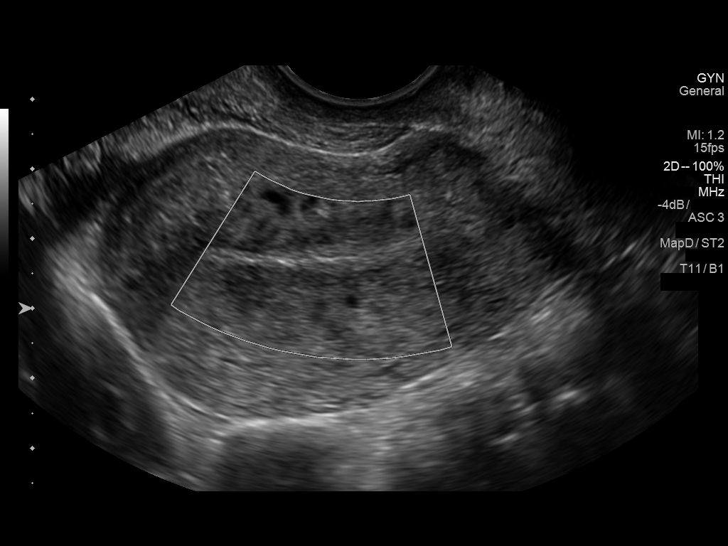
[im 66/79]
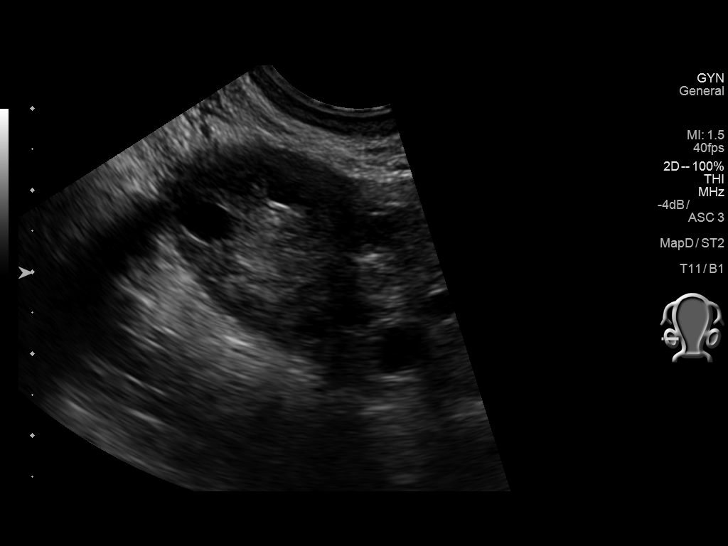
[im 72/79]
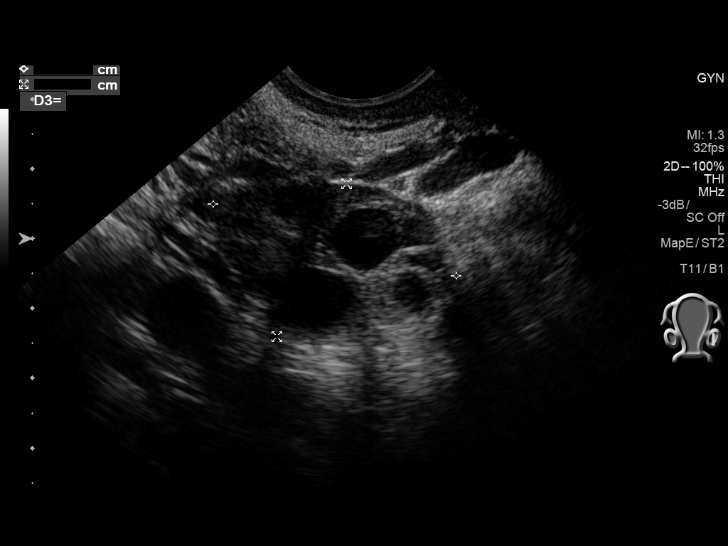
[im 79/79]
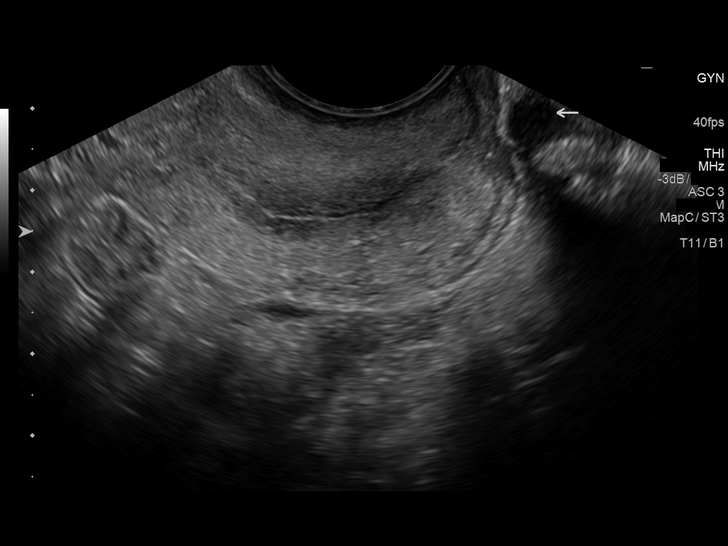

[14 of 25 positions shown; findings below may reference images not displayed]

FINDINGS: Uterus

Measurements: 8.3 x 4.4 x 5.5 cm. No fibroids or other mass
visualized.

Endometrium

Thickness: 5.3 mm.  No focal abnormality visualized.

Right ovary

Measurements: 3.1 x 1.6 x 2.5 cm. No adnexal mass. Multiple
follicles.

Left ovary

Measurements: 3.6 x 2.4 x 2.7 cm. No adnexal mass. Multiple
follicles.

Other findings

No abnormal free fluid.
IMPRESSION: Normal exam.

## 2018-07-09 ENCOUNTER — Encounter: Payer: Self-pay | Admitting: Emergency Medicine

## 2018-07-09 ENCOUNTER — Other Ambulatory Visit: Payer: Self-pay

## 2018-07-09 ENCOUNTER — Emergency Department
Admission: EM | Admit: 2018-07-09 | Discharge: 2018-07-09 | Disposition: A | Discharge status: Home / Self Care | Payer: Medicaid Other | Attending: Emergency Medicine | Admitting: Emergency Medicine

## 2018-07-09 DIAGNOSIS — Z87891 Personal history of nicotine dependence: Secondary | ICD-10-CM | POA: Insufficient documentation

## 2018-07-09 DIAGNOSIS — X58XXXA Exposure to other specified factors, initial encounter: Secondary | ICD-10-CM | POA: Diagnosis not present

## 2018-07-09 DIAGNOSIS — Y939 Activity, unspecified: Secondary | ICD-10-CM | POA: Diagnosis not present

## 2018-07-09 DIAGNOSIS — Y999 Unspecified external cause status: Secondary | ICD-10-CM | POA: Diagnosis not present

## 2018-07-09 DIAGNOSIS — H18822 Corneal disorder due to contact lens, left eye: Secondary | ICD-10-CM

## 2018-07-09 DIAGNOSIS — S0592XA Unspecified injury of left eye and orbit, initial encounter: Secondary | ICD-10-CM | POA: Diagnosis present

## 2018-07-09 DIAGNOSIS — S0502XA Injury of conjunctiva and corneal abrasion without foreign body, left eye, initial encounter: Secondary | ICD-10-CM | POA: Diagnosis not present

## 2018-07-09 DIAGNOSIS — Z79899 Other long term (current) drug therapy: Secondary | ICD-10-CM | POA: Diagnosis not present

## 2018-07-09 DIAGNOSIS — Y929 Unspecified place or not applicable: Secondary | ICD-10-CM | POA: Insufficient documentation

## 2018-07-09 MED ORDER — TETRACAINE HCL 0.5 % OP SOLN
2.0000 [drp] | Freq: Once | OPHTHALMIC | Status: AC
  Administered 2018-07-09: 2 [drp] via OPHTHALMIC
  Filled 2018-07-09: qty 4

## 2018-07-09 MED ORDER — MOXIFLOXACIN HCL 0.5 % OP SOLN: 1.0000 [drp] | Freq: Three times a day (TID) | OPHTHALMIC | 0 refills | Status: AC

## 2018-07-09 MED ORDER — FLUORESCEIN SODIUM 1 MG OP STRP
1.0000 | ORAL_STRIP | Freq: Once | OPHTHALMIC | Status: AC
  Administered 2018-07-09: 1 via OPHTHALMIC
  Filled 2018-07-09: qty 1

## 2018-07-09 NOTE — ED Triage Notes (Signed)
Says feels like something in left eye since yesterday.  Was cleaning

## 2018-07-09 NOTE — Discharge Instructions (Addendum)
You are being treated for a scratch to your cornea. To prevent infection, you are being placed on an antibiotic eye drop. This may be due to over-wearing your monthly contact lenses. I would consider resting from contact lens use for at least a day. You should sterilize your contact lens case with hot water. Use the eye drops as directed. Follow-up with [facility], [facility], [facility], if symptoms worsen.

## 2018-07-09 NOTE — ED Notes (Signed)
See triage note  Presents with left eye pain  Unsure if she has gotten something in her eye  States feels like a rock is in her eye

## 2018-07-10 NOTE — ED Provider Notes (Signed)
[facility] Emergency Department Provider Note ____________________________________________  Time seen: 1438  I have reviewed the triage vital signs and the nursing notes.  HISTORY  Chief Complaint  Eye Problem  HPI Valerie Christian is a 32 y.o. female presents herself to the ED for evaluation of left eye irritation.  Patient describes a foreign body sensation that occurred after awakening this morning.  She has appointment to use of monthly contact lenses, but reports that she takes it contact lenses out daily for cleaning and her sterilizing tray.  She denies any direct trauma to the eye, denies any crusting, matting, purulent drainage.  She also denies any dizziness, nausea, vomiting, or headache.  Patient presents now with left eye irritation as noted.  She is describing some mild light sensitivity.  History reviewed. No pertinent past medical history.  There are no active problems to display for this patient.  History reviewed. No pertinent surgical history.  Prior to Admission medications   Medication Sig Start Date End Date Taking? Authorizing Provider  Ibuprofen-Diphenhydramine HCl (ADVIL PM) 200-25 MG CAPS Take 1 capsule by mouth as needed.    [provider]  moxifloxacin (VIGAMOX) 0.5 % ophthalmic solution Place 1 drop into the left eye 3 (three) times daily for 7 days. 07/09/18 07/16/18  Denver Bentson, Charlesetta Ivory, PA-C  potassium chloride (K-DUR) 10 MEQ tablet Take 1 tablet (10 mEq total) by mouth daily. 05/29/16   Chinita Pester, FNP    Allergies Patient has no known allergies.  No family history on file.  Social History Social History   Tobacco Use  . Smoking status: Former Smoker    Packs/day: 0.50    Years: 1.00    Pack years: 0.50    Types: Cigarettes  . Smokeless tobacco: Never Used  Substance Use Topics  . Alcohol use: Yes    Comment: Occasionally  . Drug use: No    Review of Systems  Constitutional: Negative for  fever. Eyes: Negative for visual changes. Left eye foreign body sensation  ENT: Negative for sore throat. Cardiovascular: Negative for chest pain. Respiratory: Negative for shortness of breath. Musculoskeletal: Negative for back pain. Skin: Negative for rash. Neurological: Negative for headaches, focal weakness or numbness. ____________________________________________  PHYSICAL EXAM:  VITAL SIGNS: ED Triage Vitals  Enc Vitals Group     BP 07/09/18 1328 121/76     Pulse Rate 07/09/18 1328 100     Resp 07/09/18 1328 16     Temp 07/09/18 1328 98.6 F (37 C)     Temp Source 07/09/18 1328 Oral     SpO2 07/09/18 1328 94 %     Weight 07/09/18 1328 140 lb (63.5 kg)     Height 07/09/18 1328 5\' 6"  (1.676 m)     Head Circumference --      Peak Flow --      Pain Score 07/09/18 1338 10     Pain Loc --      Pain Edu? --      Excl. in GC? --     Constitutional: Alert and oriented. Well appearing and in no distress. Head: Normocephalic and atraumatic. Eyes: Conjunctivae are injected on the left. No obvious foreign body seen on gross exam and upper lid eversion. PERRL. Fluorescein dye uptake noted in a halo distribution over the cornea. Normal extraocular movements Hematological/Lymphatic/Immunological: No preauricular lymphadenopathy. Cardiovascular: Normal rate, regular rhythm. Normal distal pulses. Respiratory: Normal respiratory effort. No wheezes/rales/rhonchi. Neurologic:  CN II-XII grossly intact. Normal speech  and language. No gross focal neurologic deficits are appreciated. ____________________________________________  PROCEDURES  Procedures Tetracaine ii gtts OS ____________________________________________  INITIAL IMPRESSION / ASSESSMENT AND PLAN / ED COURSE  Patient with ED evaluation of left eye foreign body sensation X1 day.  Patient was extended wear contact lenses, and has fluorescein dye uptake does concerning for possible corneal injury secondary to prolonged contact  lens use.  She is discharged with Diamox drops to use as directed.  She is also encouraged to avoid contact lens use if possible, and if not possible to instill the eyedrops at least 15 minutes before inserting her contact lens.  She should follow-up with [facility] for ongoing symptom management.  Return precautions have been reviewed. ____________________________________________  FINAL CLINICAL IMPRESSION(S) / ED DIAGNOSES  Final diagnoses:  Abrasion of left cornea, initial encounter  Contact lens overwear of left eye      Karmen Stabs, Charlesetta Ivory, PA-C 07/10/18 1917    Jeanmarie Plant, MD 07/11/18 1104

## 2021-04-27 NOTE — Progress Notes (Signed)
 SUBJECTIVE:   Valerie Christian is a 35 y.o. y/o Female who reports to the urgent care today for a 2-day history of vaginal irritation and itching.  Patient also describes a vaginal discharge.  No odor.  No history of fever, chills, nausea, vomiting, abdominal pain or back pain.  No urinary tract symptoms.   The following portions of the patient's history were reviewed and updated as appropriate. Past Medical History:  has a past medical history of Bipolar disorder (CMS-HCC) and Schizophrenia (CMS-HCC). Problem List: has Schizophrenia (CMS-HCC) and Marijuana use, continuous on their problem list. Past Surgical History:  has no past surgical history on file. Family History: family history includes No Known Problems in her brother, father, mother, and sister. Social History:  reports that she has been smoking. She has never used smokeless tobacco. She reports that she does not drink alcohol and does not use drugs. Current Medications: has a current medication list which includes the following prescription(s): invega sustenna, clotrimazole, and metronidazole. Prior to encounter Medications:  Current Outpatient Medications on File Prior to Visit  Medication Sig Dispense Refill  . INVEGA SUSTENNA 156 mg/mL IM syringe INJECT 1ML (156MG ) INTRAMUSCULARLY EACH MONTH  3   No current facility-administered medications on file prior to visit.   Allergies: has No Known Allergies.  OBJECTIVE:   Well-developed and Well-nourished.  Alert and oriented.  Friendly and cooperative.  Vital signs:  Vitals:   04/27/21 1841  BP: 122/74  Pulse: 102  Resp: 16  Temp: 36.7 C (98.1 F)  TempSrc: Oral  SpO2: 100%  PainSc: 0-No pain     LABS:   Results for orders placed or performed in visit on 04/27/21  Wet Prep Panel  Result Value Ref Range   WBC (White Blood Cells), Vaginal Negative Negative   RBC (Red Blood Cells), Vaginal Negative Negative   Clue Cells, Vaginal 2+ (!) Negative   Bacteria, Vaginal 2+  (!) Negative   Yeast, Vaginal PRESENT (!) None Seen   Trichomonas, Vaginal None Seen None Seen   Gonorrhea and Chlamydia are pending   URGENT CARE COURSE:   N/A   ASSESSMENT/PLAN:     ICD-10-CM   1. Bacterial vaginosis  N76.0 metroNIDAZOLE (FLAGYL) 500 MG tablet   B96.89   2. Vagina, candidiasis  B37.3 clotrimazole (LOTRIMIN) 1 % vaginal cream  3. Vaginal odor  N89.8 Wet Prep Panel  4. Vaginal itching  N89.8 Chlamydia Trachomatis and Neisseria Gonorrhoeae, DNA Amplification   The patient's history and wet prep are consistent with bacterial vaginosis and vaginal candidiasis.  I discussed with the patient metronidazole and Diflucan but Diflucan interferes with Invega and may cause QT interval complications.  Therefore I replaced Diflucan with clotrimazole topical.  Recommend 1 week follow-up if no improvement.  Requested Prescriptions   Signed Prescriptions Disp Refills  . metroNIDAZOLE (FLAGYL) 500 MG tablet 14 tablet 0    Sig: Take 1 tablet (500 mg total) by mouth 2 (two) times daily for 7 days  . clotrimazole (LOTRIMIN) 1 % vaginal cream 45 g 0    Sig: Place 1 applicator vaginally at bedtime for 7 days    Metronidazole as directed for bacterial vaginosis.  It is important not to drink alcohol with this medication.  Clotrimazole vaginal cream as directed for vaginal yeast infection  Avoid all sexual activity until you have received all of your test results  You will be contacted by phone only if any of your tests are found to be abnormal and/or if you  require a change in year current therapy.  FOLLOW UP: Follow up with your primary care provider in 3 days if symptoms persist or sooner with urgent care or the hospital emergency room if symptoms worsen.  A copy of these instructions have been given to the patient or  responsible adult who Demonstrated the ability to learn, asked  appropriate questions, and verbalized understanding of the plan of  care.  There were no  barriers to learning identified.

## 2021-08-16 NOTE — Progress Notes (Signed)
 SUBJECTIVE: 35 y.o. Female presents with vaginal discharge for 1 week.  Treated for yeast vaginitis and bacterial vaginosis 3 months ago.  ROS: General/Constitutional.   No fever, chills, or sweats GI.   No abdominal pain, nausea/vomiting or diarrhea GU.   No urinary frequency or dysuria Genitalia.   As above Lymph. No swelling, red streaks or swollen lymph nodes Skin. No rashes or skin lesions   OBJECTIVE: BP 109/74   Pulse 91   Temp 36.9 C (98.5 F) (Oral)   Resp 17   Ht 167.6 cm (5' 6)   Wt 63.5 kg (140 lb)   LMP 07/16/2021   SpO2 100%   BMI 22.60 kg/m     Back.   No CVAT Lymph Nodes. No significant inguinal lymphadenopathy Abdomen.   Soft; nontender; nondistended; normoactive bowel sounds; no masses or oganomegaly GU.   Deferred secondary to self collect specimen  Labs: Wet Prep/KOH: Yeast otherwise unremarkable Urine pregnancy test negative Assessment:   ICD-10-CM   1. Vaginal discharge  N89.8 Wet Prep Panel    Chlamydia Trachomatis and Neisseria Gonorrhoeae, DNA Amplification    2. Missed menses, unspecified  N92.6 HCG Urine Qualitative, Pregnancy Test    3. Encounter for screening for infections with a predominantly sexual mode of transmission  Z11.3 Chlamydia Trachomatis and Neisseria Gonorrhoeae, DNA Amplification    4. Yeast vaginitis  B37.31       Plan: Requested Prescriptions   Signed Prescriptions Disp Refills  . terconazole (TERAZOL 3) 80 mg vaginal suppository 3 suppository 0    Sig: Place 1 suppository (80 mg total) vaginally at bedtime for 3 days    Instructions about new medications and side effects provided.  If any changes in chronic medications then new reconciled medication list is given to patient.    FOLLOW-UP:  Please return to UC or follow up with your PCP to get re-examined if not improving within 3-5 days or if your symptoms worsen.   Gonorrhea and Chlamydia test pending  A copy of these instructions have been given to the  patient or responsible adult who demonstrated the ability to learn, asked appropriate questions, and verbalized understanding of the plan of care.  There were no barriers to learning identified.  Please take the time to sign up for a MyChart Account. See sign up information in your After Visit Summary. You will be able to view lab results, make appointments, communicate with providers, and much more.

## 2021-08-16 NOTE — Result Encounter Note (Signed)
 Reviewed result with patient/family at office visit.

## 2021-08-20 NOTE — Result Encounter Note (Signed)
Results are negative

## 2021-08-27 NOTE — Progress Notes (Signed)
  Subjective:   Valerie Christian is a 35 y.o. female in today for concern for right ear pain over the past 2 days that is worse when she tries to lay on it or touch it.  She has also had a sore throat for the past 2 days as well.  Denies any fevers, sweats, chills, runny nose, stuffy nose, cough or body aches.  She has taken no medications for this.  She is not vaccinated for COVID or flu.  No recent travel.  No known sick exposures.  Patient is also requesting a pregnancy test as her last menstrual cycle earlier this month was lighter than normal.  She was here on 12/1 and had a negative pregnancy test at that time as well and wants to confirm that she is not pregnant.  States she is not trying to get pregnant.  Outpatient Medications Prior to Visit  Medication Sig Dispense Refill  . INVEGA SUSTENNA 156 mg/mL IM syringe   3  . INVEGA TRINZA 410 mg/1.32 mL IM syringe INJECT 410 MG INTRAMUSCULARLY EVERY 12 WEEKS     No facility-administered medications prior to visit.    The patients PMH, PSH, social history, allergies and medications were reviewed. Updates were made in those sections.   Objective:   Vitals:   08/27/21 1632  BP: 137/72  Pulse: 101  Resp: 16  Temp: 36.9 C (98.4 F)  TempSrc: Oral  SpO2: 99%  Weight: 63.5 kg (139 lb 15.9 oz)  Height: 167.6 cm (5' 6)  hr noted  General:   Alert and oriented, no acute distress, appropriate Skin:   No rashes, normal color, warm and dry Eyes:  Conjunctiva clear, PERRLA Ears:  Canals clear on the left; erythematous and mildly swollen in the right; Tympanic membranes - clear and flat on the left; injected but clear on the right; increased right ear pain with tragus and auricle manipulation.  No mastoid tenderness on the right side Nose:  No swelling, redness or congestion; no discharge Sinuses: Nontender to palpation Mouth:   Moist mucous membranes Throat:  2+ tonsillar hypertrophy with erythema and exudates bilaterally Neck:   Supple,  nontender; no lymphadenopathy Cardiovascular:  Regular rate and rhythm on exam Lungs:   Comfortably breathing; clear to auscultation bilaterally; no wheezing, rhonchi, or rales Neurologic:   Alert and oriented; moving all extremities  Urine pregnancy: Negative Rapid strep: Negative Rapid flu: Negative Rapid COVID: Not detected  MDM: Moderate, patient with an acute issue work with work-up of 3+ data points and prescription drug management  Assessment/Plan:   Assessment:  Exudative tonsillitis  (primary encounter diagnosis) Light menstrual flow Acute otitis externa of right ear, unspecified type  Plan:  Amoxicillin Cortisporin eardrops Ear pain relieving drops Alternate tylenol  1000mg  with 600mg  of ibuprofen every 4 hours, i.e. Tylenol  now, then in 4 hours take ibuprofen, 4 hours after that take Tylenol  and so on as needed for aches/pains/fevers.  Caution use of acetaminophen  with DayQuil, NyQuil or any other cold product that says pain reliever as you could overdose on Tylenol .  Rest Warm salty water gargles Popsicles or warm teas to soothe Discard toothbrush in 24 hours. Drink plenty of fluids (recommended 64 ounces of water a day) cepachol lozenges for sore throat Follow up if worsening or no improvement in 3 days.

## 2021-09-21 NOTE — Progress Notes (Signed)
 Chief Complaint:   Chief Complaint  Patient presents with  . Abdominal Pain    Intermittent lower abdominal pain that feels crampy x 2 weeks  . Nausea    X 2 weeks    HPI:  Valerie Christian is a 36 y.o. female who presents with chief complaint of abdominal pain.  Patient reports that she has had crampy abdominal pain on and off for the past 2 weeks.  Last seconds is located in the lower midline abdomen.  She states pain feels like a crampy sensation.  She is expecting her menstrual cycle to start in the next couple days.  She does not believe she is pregnant but she thinks she could be and hence she came today for pregnancy testing.  She has had some urinary frequency.  She denies dysuria, urgency, nausea, vomiting, bowel habit changes, fevers, unusual vaginal discharge, suspicion of STD    ROS: As in HPI   Allergies: has No Known Allergies.  Prior to encounter Medications:  Current Outpatient Medications on File Prior to Visit  Medication Sig Dispense Refill  . INVEGA TRINZA 410 mg/1.32 mL IM syringe INJECT 410 MG INTRAMUSCULARLY EVERY 12 WEEKS    . INVEGA SUSTENNA 156 mg/mL IM syringe  (Patient not taking: Reported on 09/21/2021)  3   No current facility-administered medications on file prior to visit.    Past Medical History:  has a past medical history of Bipolar disorder (CMS-HCC) and Schizophrenia (CMS-HCC).  Past Surgical History:  has no past surgical history on file.  Past Family History:   Family History  Problem Relation Age of Onset  . No Known Problems Mother   . No Known Problems Father   . No Known Problems Sister   . No Known Problems Brother     Social History:  reports that she has been smoking. She has never used smokeless tobacco. She reports that she does not drink alcohol and does not use drugs.  Physical Exam:   Vitals:   09/21/21 1026  BP: 102/70  Pulse: 94  Resp: 18  Temp: 36.7 C (98.1 F)  TempSrc: Oral  SpO2: 99%  Weight: 73.6 kg (162  lb 4.1 oz)  Height: 167.6 cm (5' 6)  Body mass index is 26.19 kg/m. Physical Exam  General: Pleasant 36 year old female standing upright, then sitting upright, laying flat on cart, altering position on cart with ease, in no apparent distress.appropriate mood and affect.  Head: normocephalic, atraumatic.  Eyes:  EOMI,  anicteric sclera,    Neck: good ROM  Cardiovascular: S1 S2 normal, regular rate and rhythm without murmur/gallup, or friction rub.   Respiratory: no respiratory distress  Abdomen: Soft, nondistended, nontender, bowel sounds positive  Back: good ROM   Extremities: normal bulk and tone of musculature, normal ROM, no edema   Skin: warm, dry, nontender, no rash on exposed skin  Neurological: alert, normal affect/behavior, answering all questions appropriately,following all simple directions, moving all extremities, speech/gait normal  Assessment /Plan :    Results for orders placed or performed in visit on 09/21/21  HCG Urine Qualitative, Pregnancy Test  Result Value Ref Range   Human Chorionic Gonadotropin  (HCG) Preg Test, Urine Qual Negative Negative   Narrative   NEGATIVE INTERPRETATION:  This test utilizes both monoclonal and polyclonal antibodies and is designed to selectively detect HCG in serum or urine.  Very dilute urine specimens, as indicated by low specific gravity (<= 1.005), may not contain representative urinary hcg concentrations, thus yielding a false  negative result.  This test is reported to be sensitive enough to show positive results as early as one week after implantation or 4-5 days before a first missed period.   If a test is reported as negative and pregnancy is suspected a new specimen should be obtained after 48 hours or more and the test repeated. Weakly positive results should be confirmed by serum quantitation and may require repeat testing after 48-72 hours, as non pregnant females may have levels between 0-3 MIU/mL.    [facility] POC  Urinalysis Chemical  Result Value Ref Range   Color Dark Yellow Colorless, Straw, Yellow, Light Yellow, Dark Yellow   Clarity SL Cloudy (!) Clear   Specific Gravity >=1.030 1.005 - 1.030   pH, Urine 6.0 5.0 - 8.0   Protein, Urinalysis Negative Negative   Glucose, Urinalysis Negative Negative   Ketones, Urinalysis Negative Negative   Blood, Urinalysis Negative Negative   Nitrite, Urinalysis Negative Negative   Leukocytes, Urinalysis Negative Negative   Bilirubin, Urinalysis Negative Negative   Urobilinogen, Urinalysis 0.2 0.2 - 1.0 mg/dL   Narrative   POC TEST(S) ABOVE PERFORMED AT THE PATIENT CARE LOCATION AND OVERSEEN BY THE [facility] POCT PROGRAM.    Above test result(s) reviewed with patient  Urine culture sent Reviewed discharge instructions with patient Please refer to discharge instructions   Patient verbalized understanding of verbal and written instructions .      ICD-10-CM   1. Generalized abdominal pain  R10.84 [facility] POC Urinalysis Chemical    HCG Urine Qualitative, Pregnancy Test    [facility] POC Urinalysis Chemical    2. Urinary frequency  R35.0 Culture, Urine          Prescribed Medications:    Requested Prescriptions    No prescriptions requested or ordered in this encounter    .  This note was prepared using Conservation officer, historic buildings.  (This note was partially made with the aid of speech-to-text dictation- Erroneous Autocorrect might be occur Typographical errors are not intentional.)     Rollene Quivers MD

## 2023-05-23 NOTE — Progress Notes (Signed)
 [facility] [employer] was  able to reach the patient by phone call.  The details of interventions are as follows:  Preventive Care-     05/23/2023    4:01 PM  Gap Closure  Age Group: Adult  Adult Yearly Physical Interventions Patient will schedule themselves  Comments: pt didnt decline, but was given info about why we call, pt said maybe not yet, was intructed to call myself or a PCP clinic thats accepting new pt according to website.    HALEY BALLARD  For more information on [facility] services, click here.

## 2023-05-23 NOTE — Progress Notes (Signed)
 [facility] [employer] was not able to reach the patient by phone call.  The details of interventions are as follows:  Preventive Care-     05/23/2023    3:51 PM  Gap Closure  Age Group: Adult  Adult Yearly Physical Interventions Unable to reach-- reminder message sent  Comments: VM full    HALEY BALLARD  For more information on [facility] services, click here.

## 2023-08-25 NOTE — Progress Notes (Signed)
 [facility] - Documentation Encounter  Care Coordinator conducted a chart review for the following:  Preventive Care-     08/25/2023    2:17 PM  Gap Closure  Age Group: Adult  Adult Yearly Physical Interventions Chart Review Only - Exception Found (Note in Comments)  Comments: per sept outreach pt will call to schedule if she decides to do AWV    Valerie Christian  For more information on [facility] services, click here.

## 2024-03-23 NOTE — Progress Notes (Signed)
 [facility] New Patient Evaluation   Chief Complaint: Nexplanon removal  History of Present Illness:  Valerie Christian is a 38 y.o. G3P0030 seen for the above chief complaint. PMH is notable for schizophrenia.  Presents with mom today.  Patient states Nexplanon was placed in 2018/2019 in [place] and has been unable to be removed.  Cannot palpate it.  Believes it to be in left arm  Review of Systems: Pertinent positive/negative documented in HPI and all other systems reviewed and negative.   Past Medical History:  Past Medical History:  Diagnosis Date  . Bipolar disorder (CMS/HHS-HCC)   . Schizophrenia (CMS/HHS-HCC)     Past Surgical History: History reviewed. No pertinent surgical history.   Medications:   Current Outpatient Medications:  .  INVEGA SUSTENNA 156 mg/mL IM syringe, , Disp: , Rfl: 3 .  INVEGA TRINZA 410 mg/1.32 mL IM syringe, INJECT 410 MG INTRAMUSCULARLY EVERY 12 WEEKS, Disp: , Rfl:    Family History:  Family History  Problem Relation Name Age of Onset  . No Known Problems Mother    . No Known Problems Father    . No Known Problems Sister    . No Known Problems Brother      Physical Exam:  Vitals:   03/23/24 1059  BP: 123/83  Pulse: 86  Resp: 16  Temp: 36.7 C (98.1 F)   General appearance: Well appearing and well-developed in no acute distress.  Upper extremity - unable to palpate Nexplanon in either upper extremity.  Possible Nexplanon scar in the left arm in the typical location of insertion.  Assessment/Plan: Assessment & Plan Nexplanon in place Unable to palpate a nexplanon today.  Will obtain bilateral upper extremity x-ray to attempt to locate.  If unable to find on x-ray, plan chest x-ray to make sure no migration.  Question whether Nexplanon was placed if unable to identify on imaging given her mental health history. Orders Placed This Encounter  Procedures  . X-ray humerus right minimum 2 views  . X-ray humerus left minimum 2 views    Requested Prescriptions    No prescriptions requested or ordered in this encounter     Some of this progress note was created using dragon dictation software, please excuse any errors.    Attestation Statement:   I personally performed the service. (TP)  CLAYTON JERILYNN ANDERSON, MD
